# Patient Record
Sex: Male | Born: 1970 | Race: White | Hispanic: No | Marital: Married | Smoking: Former smoker
Health system: Southern US, Community
[De-identification: ages and names within clinical notes are randomized; demographics above are authoritative.]

## PROBLEM LIST (undated history)

## (undated) DIAGNOSIS — E8881 Metabolic syndrome: Secondary | ICD-10-CM

## (undated) DIAGNOSIS — K760 Fatty (change of) liver, not elsewhere classified: Secondary | ICD-10-CM

## (undated) DIAGNOSIS — I700 Atherosclerosis of aorta: Secondary | ICD-10-CM

## (undated) DIAGNOSIS — I10 Essential (primary) hypertension: Secondary | ICD-10-CM

## (undated) DIAGNOSIS — J309 Allergic rhinitis, unspecified: Secondary | ICD-10-CM

## (undated) DIAGNOSIS — F3341 Major depressive disorder, recurrent, in partial remission: Secondary | ICD-10-CM

## (undated) DIAGNOSIS — F17200 Nicotine dependence, unspecified, uncomplicated: Secondary | ICD-10-CM

## (undated) DIAGNOSIS — J45909 Unspecified asthma, uncomplicated: Secondary | ICD-10-CM

## (undated) HISTORY — DX: Allergic rhinitis, unspecified: J30.9

## (undated) HISTORY — DX: Essential (primary) hypertension: I10

## (undated) HISTORY — DX: Fatty (change of) liver, not elsewhere classified: K76.0

## (undated) HISTORY — PX: NASAL SEPTOPLASTY W/ TURBINOPLASTY: SHX2070

## (undated) HISTORY — DX: Major depressive disorder, recurrent, in partial remission: F33.41

## (undated) HISTORY — DX: Atherosclerosis of aorta: I70.0

## (undated) HISTORY — DX: Nicotine dependence, unspecified, uncomplicated: F17.200

## (undated) HISTORY — DX: Unspecified asthma, uncomplicated: J45.909

## (undated) HISTORY — DX: Metabolic syndrome: E88.810

## (undated) HISTORY — DX: Metabolic syndrome: E88.81

---

## 2016-08-14 DIAGNOSIS — Z1389 Encounter for screening for other disorder: Secondary | ICD-10-CM | POA: Diagnosis not present

## 2016-08-14 DIAGNOSIS — R030 Elevated blood-pressure reading, without diagnosis of hypertension: Secondary | ICD-10-CM | POA: Diagnosis not present

## 2016-08-14 DIAGNOSIS — Z720 Tobacco use: Secondary | ICD-10-CM | POA: Diagnosis not present

## 2016-09-08 DIAGNOSIS — Z0000 Encounter for general adult medical examination without abnormal findings: Secondary | ICD-10-CM | POA: Diagnosis not present

## 2016-09-08 DIAGNOSIS — Z131 Encounter for screening for diabetes mellitus: Secondary | ICD-10-CM | POA: Diagnosis not present

## 2016-09-08 DIAGNOSIS — Z125 Encounter for screening for malignant neoplasm of prostate: Secondary | ICD-10-CM | POA: Diagnosis not present

## 2017-02-10 DIAGNOSIS — J0100 Acute maxillary sinusitis, unspecified: Secondary | ICD-10-CM | POA: Diagnosis not present

## 2017-02-11 DIAGNOSIS — S60222A Contusion of left hand, initial encounter: Secondary | ICD-10-CM | POA: Insufficient documentation

## 2017-02-11 DIAGNOSIS — Y929 Unspecified place or not applicable: Secondary | ICD-10-CM | POA: Diagnosis not present

## 2017-02-11 DIAGNOSIS — Y990 Civilian activity done for income or pay: Secondary | ICD-10-CM | POA: Insufficient documentation

## 2017-02-11 DIAGNOSIS — W51XXXA Accidental striking against or bumped into by another person, initial encounter: Secondary | ICD-10-CM | POA: Diagnosis not present

## 2017-02-11 DIAGNOSIS — Y939 Activity, unspecified: Secondary | ICD-10-CM | POA: Diagnosis not present

## 2017-02-11 DIAGNOSIS — S6992XA Unspecified injury of left wrist, hand and finger(s), initial encounter: Secondary | ICD-10-CM | POA: Diagnosis present

## 2017-02-12 ENCOUNTER — Other Ambulatory Visit: Payer: Self-pay

## 2017-02-12 ENCOUNTER — Emergency Department (HOSPITAL_COMMUNITY)
Admission: EM | Admit: 2017-02-12 | Discharge: 2017-02-12 | Disposition: A | Discharge status: Home / Self Care | Payer: Worker's Compensation | Attending: Emergency Medicine | Admitting: Emergency Medicine

## 2017-02-12 ENCOUNTER — Emergency Department (HOSPITAL_COMMUNITY): Payer: Worker's Compensation

## 2017-02-12 ENCOUNTER — Encounter (HOSPITAL_COMMUNITY): Payer: Self-pay | Admitting: Emergency Medicine

## 2017-02-12 DIAGNOSIS — S60222A Contusion of left hand, initial encounter: Secondary | ICD-10-CM
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
## 2017-02-12 NOTE — ED Notes (Signed)
Provider notified of elevated BP

## 2017-02-12 NOTE — Discharge Instructions (Signed)
X-ray was negative.  We suspect bony contusion. Ice and elevate as needed.  Anti-inflammatories for pain. Follow-up with PCP if any ongoing issues. Return here for any new/worsening symptoms.
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
## 2017-05-07 DIAGNOSIS — E8881 Metabolic syndrome: Secondary | ICD-10-CM | POA: Diagnosis not present

## 2017-05-07 DIAGNOSIS — I10 Essential (primary) hypertension: Secondary | ICD-10-CM | POA: Diagnosis not present

## 2017-05-07 DIAGNOSIS — Z720 Tobacco use: Secondary | ICD-10-CM | POA: Diagnosis not present

## 2017-12-29 DIAGNOSIS — I10 Essential (primary) hypertension: Secondary | ICD-10-CM | POA: Diagnosis not present

## 2017-12-29 DIAGNOSIS — Z720 Tobacco use: Secondary | ICD-10-CM | POA: Diagnosis not present

## 2021-03-25 ENCOUNTER — Encounter: Payer: Self-pay | Admitting: Pulmonary Disease

## 2021-03-25 ENCOUNTER — Ambulatory Visit (INDEPENDENT_AMBULATORY_CARE_PROVIDER_SITE_OTHER): Payer: 59 | Admitting: Pulmonary Disease

## 2021-03-25 ENCOUNTER — Other Ambulatory Visit: Payer: Self-pay

## 2021-03-25 VITALS — BP 154/84 | HR 72 | Temp 98.4°F | Ht 74.0 in | Wt 227.0 lb

## 2021-03-25 DIAGNOSIS — R059 Cough, unspecified: Secondary | ICD-10-CM

## 2021-03-25 LAB — CBC WITH DIFFERENTIAL/PLATELET
Basophils Absolute: 0.1 10*3/uL (ref 0.0–0.1)
Basophils Relative: 0.4 % (ref 0.0–3.0)
Eosinophils Absolute: 0.2 10*3/uL (ref 0.0–0.7)
Eosinophils Relative: 1.5 % (ref 0.0–5.0)
HCT: 49.3 % (ref 39.0–52.0)
Hemoglobin: 16.6 g/dL (ref 13.0–17.0)
Lymphocytes Relative: 36.2 % (ref 12.0–46.0)
Lymphs Abs: 5.1 10*3/uL — ABNORMAL HIGH (ref 0.7–4.0)
MCHC: 33.6 g/dL (ref 30.0–36.0)
MCV: 90.2 fl (ref 78.0–100.0)
Monocytes Absolute: 1.1 10*3/uL — ABNORMAL HIGH (ref 0.1–1.0)
Monocytes Relative: 7.6 % (ref 3.0–12.0)
Neutro Abs: 7.7 10*3/uL (ref 1.4–7.7)
Neutrophils Relative %: 54.3 % (ref 43.0–77.0)
Platelets: 255 10*3/uL (ref 150.0–400.0)
RBC: 5.47 Mil/uL (ref 4.22–5.81)
RDW: 13.3 % (ref 11.5–15.5)
WBC: 14.2 10*3/uL — ABNORMAL HIGH (ref 4.0–10.5)

## 2021-03-25 NOTE — Patient Instructions (Signed)
We will get some labs including CBC differential, IgE Schedule high-res CT and PFTs for better evaluation of the lung Follow-up in clinic in 1 to 2 months

## 2021-03-25 NOTE — Progress Notes (Deleted)
° °  NAME:  Wayne Davidson, MRN:  [id number], DOB:  1970-03-29, LOS: 0 ADMISSION DATE:  (Not on file), CONSULTATION DATE:  *** REFERRING MD:  ***, CHIEF COMPLAINT:  ***   History of Present Illness:  ***  Pertinent  Medical History  ***  Significant Hospital Events: Including procedures, antibiotic start and stop dates in addition to other pertinent events     Interim History / Subjective:  ***  Objective   Blood pressure (!) 154/84, pulse 72, temperature 98.4 F (36.9 C), temperature source Oral, height 6\' 2"  (1.88 m), weight 227 lb (103 kg), SpO2 97 %. @HEMODYNAMICS @     @IOBRIEF @   03/25/21 0853  Weight: 227 lb (103 kg)    Examination: General: *** HENT: *** Lungs: *** Cardiovascular: *** Abdomen: *** Extremities: *** Neuro: *** GU: ***  Resolved Hospital Problem list   ***  Assessment & Plan:  ***  Best Practice (right click and "Reselect all SmartList Selections" daily)   Diet/type: {diet type:25684} DVT prophylaxis: {anticoagulation (Optional):25687} GI prophylaxis: Lines: {Central Venous Access:25771} Foley:  {Central Venous Access:25691} Code Status:  {Code Status:26939} Last date of multidisciplinary goals of care discussion [***]  Labs   CBC: No results for input(s): WBC, NEUTROABS, HGB, HCT, MCV, PLT in the last 168 hours.  Basic Metabolic Panel: No results for input(s): NA, K, CL, CO2, GLUCOSE, BUN, CREATININE, CALCIUM, MG, PHOS in the last 168 hours. GFR: CrCl cannot be calculated (No successful lab value found.). No results for input(s): PROCALCITON, WBC, LATICACIDVEN in the last 168 hours.  Liver Function Tests: No results for input(s): AST, ALT, ALKPHOS, BILITOT, PROT, ALBUMIN in the last 168 hours. No results for input(s): LIPASE, AMYLASE in the last 168 hours. No results for input(s): AMMONIA in the last 168 hours.  ABG No results found for: PHART, PCO2ART, PO2ART, HCO3, TCO2, ACIDBASEDEF, O2SAT    Coagulation Profile: No results for input(s): INR, PROTIME in the last 168 hours.  Cardiac Enzymes: No results for input(s): CKTOTAL, CKMB, CKMBINDEX, TROPONINI in the last 168 hours.  HbA1C: No results found for: HGBA1C  CBG: No results for input(s): GLUCAP in the last 168 hours.  Review of Systems:   ***  Past Medical History:  He,  has no past medical history on file.   Surgical History:  No past surgical history on file.   Social History:   reports that he has never smoked. He has quit using smokeless tobacco. He reports current alcohol use. He reports that he does not use drugs.   Family History:  His family history includes Diabetes in an other family member; Hypertension in an other family member.   Allergies Allergies  Allergen Reactions   Naprosyn [Naproxen]      Home Medications  Prior to Admission medications   Medication Sig Start Date End Date Taking? Authorizing Provider  albuterol (VENTOLIN HFA) 108 (90 Base) MCG/ACT inhaler Inhale 2 puffs into the lungs every 4 (four) hours as needed. 03/19/21   [provider]  buPROPion (WELLBUTRIN SR) 150 MG 12 hr tablet Take 150 mg by mouth 2 (two) times daily. 03/24/21   [provider]  levocetirizine (XYZAL) 5 MG tablet SMARTSIG:1 Tablet(s) By Mouth Every Evening 02/08/21   [provider]     Critical care time: ***

## 2021-03-25 NOTE — Progress Notes (Signed)
Wayne Kuperman    MD:[id number]    [birth year]  Primary Care Physician:[facility], [facility]  Referring Physician: Physicians, [place],  [place] [address]  Chief complaint: Consult for chronic cough  HPI: 51 year old with history of allergics, deviated septum, prior COVID infection Complains of chronic cough, intermittent chest tightness for the past 1 year.  Symptoms worsened after COVID infection in October 2021 when he was hospitalized at [facility] for 2 days Cough is usually nonproductive in nature with no fevers or chills  He was evaluated by urgent care at [facility] in early January 2023 with a chest x-ray, EKG and blood work and was told they were all normal. Recently started on antiacid medication by his primary care doctor.  He has history of intermittent allergies and deviated nasal septum and underwent sinus surgery in November 2022 by Dr. Gaylyn Cheers, ENT in [place].  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  No feather pillows or comforters Smoking history: 5-pack-year smoker.  Quit in 2022 Travel history: Born in [place].  No significant recent travel Relevant family history: No family history of lung disease  Outpatient Encounter Medications as of 03/25/2021  Medication Sig   albuterol (VENTOLIN HFA) 108 (90 Base) MCG/ACT inhaler Inhale 2 puffs into the lungs every 4 (four) hours as needed.   buPROPion (WELLBUTRIN SR) 150 MG 12 hr tablet Take 150 mg by mouth 2 (two) times daily.   levocetirizine (XYZAL) 5 MG tablet SMARTSIG:1 Tablet(s) By Mouth Every Evening   No facility-administered encounter medications on file as of 03/25/2021.    Allergies as of 03/25/2021 - Review Complete 03/25/2021  Allergen Reaction Noted   Naprosyn [naproxen]   02/12/2017    No past medical history on file.  No past surgical history on file.  Family History  Problem Relation Age of Onset   Hypertension Other    Diabetes Other     Social History   Socioeconomic History   Marital status: Married    Spouse name: Not on file   Number of children: Not on file   Years of education: Not on file   Highest education level: Not on file  Occupational History   Not on file  Tobacco Use   Smoking status: Never   Smokeless tobacco: Former  Substance and Sexual Activity   Alcohol use: Yes    Comment: social   Drug use: No   Sexual activity: Not on file  Other Topics Concern   Not on file  Social History Narrative   Not on file   Social Determinants of Health   Financial Resource Strain: Not on file  Food Insecurity: Not on file  Transportation Needs: Not on file  Physical Activity: Not on file  Stress: Not on file  Social Connections: Not on file  Intimate Partner Violence: Not on file    Review of systems: Review of Systems  Constitutional: Negative for fever and chills.  HENT: Negative.   Eyes: Negative for blurred vision.  Respiratory: as per HPI  Cardiovascular: Negative for chest pain and palpitations.  Gastrointestinal: Negative for vomiting, diarrhea, blood per rectum. Genitourinary: Negative for dysuria, urgency, frequency and hematuria.  Musculoskeletal: Negative for myalgias, back pain and joint pain.  Skin: Negative for itching and rash.  Neurological: Negative for dizziness, tremors, focal weakness, seizures and loss of consciousness.  Endo/Heme/Allergies: Negative for environmental allergies.  Psychiatric/Behavioral: Negative for depression, suicidal ideas and hallucinations.  All other systems reviewed and are negative.  Physical Exam: Blood pressure (!) 154/84, pulse 72, temperature 98.4 F (36.9 C), temperature source Oral, height 6\' 2"  (1.88 m), weight 227 lb (103 kg), SpO2 97 %. Gen:      No acute  distress HEENT:  EOMI, sclera anicteric Neck:     No masses; no thyromegaly Lungs:    Clear to auscultation bilaterally; normal respiratory effort CV:         Regular rate and rhythm; no murmurs Abd:      + bowel sounds; soft, non-tender; no palpable masses, no distension Ext:    No edema; adequate peripheral perfusion Skin:      Warm and dry; no rash Neuro: alert and oriented x 3 Psych: normal mood and affect  Data Reviewed: Imaging: CT angiogram [place] 12/24/2019-moderate bilateral multifocal pulmonary infiltrates, no pulmonary embolism I have reviewed the images personally  PFTs:  Labs:  Assessment:  Chronic cough Exacerbated by COVID infection in 2021 Suspect he may have baseline reactive airway disease given history of chronic allergies, sinusitis, deviated nasal septum.  This may have been exacerbated by COVID-19. Will also need assessment for post-COVID interstitial lung disease as he had bilateral inflammatory changes during his COVID infection in 2021  He is getting treated for postnasal drip with Flonase and Xyzal and for GERD with recent addition of a PPI Check CBC differential, IgE Schedule high-res CT and PFTs.  Based on these review he may need additional treatment with inhalers or steroids.  Plan/Recommendations: CBC, IgE High-res CT, PFTs  Marshell Garfinkel MD [facility] 03/25/2021, 9:37 AM  CC: [facility], [facility]*

## 2021-03-27 LAB — IGE: IgE (Immunoglobulin E), Serum: 187 kU/L — ABNORMAL HIGH (ref <=114)

## 2021-04-11 ENCOUNTER — Ambulatory Visit (INDEPENDENT_AMBULATORY_CARE_PROVIDER_SITE_OTHER)
Admission: RE | Admit: 2021-04-11 | Discharge: 2021-04-11 | Disposition: A | Discharge status: Home / Self Care | Payer: 59 | Source: Ambulatory Visit | Attending: Pulmonary Disease | Admitting: Pulmonary Disease

## 2021-04-11 ENCOUNTER — Other Ambulatory Visit: Payer: Self-pay

## 2021-04-11 DIAGNOSIS — R059 Cough, unspecified: Secondary | ICD-10-CM

## 2021-04-15 ENCOUNTER — Telehealth: Payer: Self-pay | Admitting: Pulmonary Disease

## 2021-04-15 NOTE — Telephone Encounter (Signed)
CT does not show any significant lung issues or changes from COVID infection. There are findings suggestive of mild asthma. I will discuss in detail at time of return visit  His labs also show mild elevation in WBC count and we will repeat them at return clinic visit.

## 2021-04-15 NOTE — Telephone Encounter (Signed)
Spoke to patient, who is requesting CT results.   Dr. Vaughan Browner, please advise. Thanks

## 2021-04-15 NOTE — Telephone Encounter (Signed)
Patient is aware of results and voiced his understanding.  Nothing further needed at this time.   

## 2021-05-05 ENCOUNTER — Other Ambulatory Visit: Payer: Self-pay | Admitting: Pulmonary Disease

## 2021-05-05 LAB — SARS CORONAVIRUS 2 (TAT 6-24 HRS): SARS Coronavirus 2: NEGATIVE

## 2021-05-08 ENCOUNTER — Other Ambulatory Visit: Payer: Self-pay

## 2021-05-08 ENCOUNTER — Encounter: Payer: Self-pay | Admitting: Pulmonary Disease

## 2021-05-08 ENCOUNTER — Ambulatory Visit: Payer: 59 | Admitting: Pulmonary Disease

## 2021-05-08 ENCOUNTER — Ambulatory Visit (INDEPENDENT_AMBULATORY_CARE_PROVIDER_SITE_OTHER): Payer: 59 | Admitting: Pulmonary Disease

## 2021-05-08 VITALS — BP 138/80 | HR 88 | Temp 98.2°F | Ht 73.0 in | Wt 224.0 lb

## 2021-05-08 DIAGNOSIS — R059 Cough, unspecified: Secondary | ICD-10-CM

## 2021-05-08 DIAGNOSIS — J4530 Mild persistent asthma, uncomplicated: Secondary | ICD-10-CM

## 2021-05-08 LAB — PULMONARY FUNCTION TEST
DL/VA % pred: 98 %
DL/VA: 4.31 ml/min/mmHg/L
DLCO cor % pred: 92 %
DLCO cor: 29.68 ml/min/mmHg
DLCO unc % pred: 97 %
DLCO unc: 31.23 ml/min/mmHg
FEF 25-75 Post: 5.25 L/sec
FEF 25-75 Pre: 4.14 L/sec
FEF2575-%Change-Post: 26 %
FEF2575-%Pred-Post: 141 %
FEF2575-%Pred-Pre: 111 %
FEV1-%Change-Post: 5 %
FEV1-%Pred-Post: 91 %
FEV1-%Pred-Pre: 86 %
FEV1-Post: 3.92 L
FEV1-Pre: 3.7 L
FEV1FVC-%Change-Post: 0 %
FEV1FVC-%Pred-Pre: 110 %
FEV6-%Change-Post: 5 %
FEV6-%Pred-Post: 85 %
FEV6-%Pred-Pre: 80 %
FEV6-Post: 4.57 L
FEV6-Pre: 4.33 L
FEV6FVC-%Pred-Post: 103 %
FEV6FVC-%Pred-Pre: 103 %
FVC-%Change-Post: 5 %
FVC-%Pred-Post: 82 %
FVC-%Pred-Pre: 78 %
FVC-Post: 4.57 L
FVC-Pre: 4.33 L
Post FEV1/FVC ratio: 86 %
Post FEV6/FVC ratio: 100 %
Pre FEV1/FVC ratio: 85 %
Pre FEV6/FVC Ratio: 100 %
RV % pred: 97 %
RV: 2.17 L
TLC % pred: 93 %
TLC: 7.06 L

## 2021-05-08 MED ORDER — BUDESONIDE-FORMOTEROL FUMARATE 160-4.5 MCG/ACT IN AERO: 2.0000 | INHALATION_SPRAY | Freq: Two times a day (BID) | RESPIRATORY_TRACT | 5 refills | Status: DC

## 2021-05-08 NOTE — Progress Notes (Signed)
Full PFT performed today. °

## 2021-05-08 NOTE — Progress Notes (Signed)
Kron Markunas    MD:[id number]    January 24, 1971  Primary Care Physician:[facility]  Referring Physician: Phys[facility], [address] [place],  [place] [address]  Chief complaint: Follow-up for chronic cough, mild asthma  HPI: 51 year old with history of allergics, deviated septum, prior COVID infection Complains of chronic cough, intermittent chest tightness for the past 1 year.  Symptoms worsened after COVID infection in October 2021 when he was hospitalized at [facility] for 2 days Cough is usually nonproductive in nature with no fevers or chills  He was evaluated by urgent care at [facility] in early January 2023 with a chest x-ray, EKG and blood work and was told they were all normal. Recently started on antiacid medication by his primary care doctor.  He has history of intermittent allergies and deviated nasal septum and underwent sinus surgery in November 2022 by Dr. Gaylyn Cheers, ENT in [place].  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  No feather pillows or comforters Smoking history: 5-pack-year smoker.  Quit in 2022 Travel history: Born in [place].  No significant recent travel Relevant family history: No family history of lung disease  Interim history: Here for review of CT and PFTs States that cough is stable with no change.  Continues to feel intermittent chest tightness  Outpatient Encounter Medications as of 05/08/2021  Medication Sig   albuterol (VENTOLIN HFA) 108 (90 Base) MCG/ACT inhaler Inhale 2 puffs into the lungs every 4 (four) hours as needed.   buPROPion (WELLBUTRIN SR) 150 MG 12 hr tablet Take 150 mg by mouth 2 (two) times daily.   levocetirizine (XYZAL) 5 MG tablet SMARTSIG:1 Tablet(s) By Mouth Every Evening   No facility-administered  encounter medications on file as of 05/08/2021.    Physical Exam: Blood pressure 138/80, pulse 88, temperature 98.2 F (36.8 C), temperature source Oral, height 6\' 1"  (1.854 m), weight 224 lb (101.6 kg), SpO2 100 %. Gen:      No acute distress HEENT:  EOMI, sclera anicteric Neck:     No masses; no thyromegaly Lungs:    Clear to auscultation bilaterally; normal respiratory effort CV:         Regular rate and rhythm; no murmurs Abd:      + bowel sounds; soft, non-tender; no palpable masses, no distension Ext:    No edema; adequate peripheral perfusion Skin:      Warm and dry; no rash Neuro: alert and oriented x 3 Psych: normal mood and affect   Data Reviewed: Imaging: CT angiogram [place] 12/24/2019-moderate bilateral multifocal pulmonary infiltrates, no pulmonary embolism  High-resolution CT chest 04/11/2021-no ILD, mild air trapping, aortic atherosclerosis and hepatic steatosis I have reviewed the images personally  PFTs: 05/08/2021 FVC 4.57 [82%], FEV1 3.92 [91%], F/F 86, TLC 7.06 [93%], DLCO 31.23 [97%] Normal test  Labs: CBC 03/25/2021-WBC 14.2, eos 1.5%, absolute eosinophil count 213 IgE 03/25/2021-187  Assessment:  Chronic cough, mild asthma Exacerbated by COVID infection in 2021 Suspect he may have baseline reactive airway disease given history of chronic allergies, sinusitis, deviated nasal septum and elevated IgE.    CT reviewed with mild trapping and no evidence of interstitial lung disease or post-COVID issues He is getting treated for postnasal drip with Flonase and Xyzal and  for GERD with recent addition of a PPI Trial of Symbicort 160  Aortic atherosclerosis, hepatic steatosis Noted on CT.  We discussed this in office today.  He will follow-up with primary care regarding this.  Plan/Recommendations: Symbicort 160 Continue Flonase, Xyzal, PPI  Marshell Garfinkel MD [place] [facility] 05/08/2021, 10:04 AM  CC: [facility], [place]

## 2021-05-08 NOTE — Patient Instructions (Signed)
Full PFT performed today. °

## 2021-05-08 NOTE — Patient Instructions (Signed)
I have reviewed your CT scan which does not show significant lung issues.  There is evidence of plaque buildup in the aorta and the liver.  Please follow-up with your primary care about this Your lung function tests are normal which is good news We will start you on an inhaler called Symbicort 160  Follow-up in 4 to 6 months

## 2021-08-07 ENCOUNTER — Telehealth: Payer: Self-pay | Admitting: Pulmonary Disease

## 2021-08-08 NOTE — Telephone Encounter (Signed)
Called patient but he did not answer. Left message for him to call back.  

## 2021-09-02 NOTE — Progress Notes (Deleted)
Cardiology Office Note:    Date:  09/02/2021   ID:  Wayne Davidson, DOB Jul 17, 1970, MRN [id number]  PCP:  Physicians, Cheryl[facility] Providers Cardiologist:  None {    Referring MD: Physicians, Quin Hoop*    History of Present Illness:    Wayne Davidson is a 51 y.o. male with a hx of aortic atherosclerosis, asthma, HTN, tobacco abuse and depression who was referred by [id number] for aortic atherosclerosis.  Today, ***  Past Medical History:  Diagnosis Date   Allergic rhinitis    Aortic atherosclerosis (HCC)    Asthma    Hepatic steatosis    HTN (hypertension)    Metabolic syndrome    Recurrent major depression in partial remission (HCC)    Tobacco use disorder     Past Surgical History:  Procedure Laterality Date   NASAL SEPTOPLASTY W/ TURBINOPLASTY      Current Medications: No outpatient medications have been marked as taking for the 09/04/21 encounter (Appointment) with Meriam Sprague, MD.     Allergies:   Naprosyn [naproxen]   Social History   Socioeconomic History   Marital status: Married    Spouse name: Not on file   Number of children: Not on file   Years of education: Not on file   Highest education level: Not on file  Occupational History   Not on file  Tobacco Use   Smoking status: Former    Types: Cigarettes    Quit date: 09/02/2009    Years since quitting: 12.0   Smokeless tobacco: Former    Types: Chew    Quit date: 09/02/2009  Substance and Sexual Activity   Alcohol use: Yes    Comment: social   Drug use: No   Sexual activity: Not on file  Other Topics Concern   Not on file  Social History Narrative   Not on file   Social Determinants of Health   Financial Resource Strain: Not on file  Food Insecurity: Not on file  Transportation Needs: Not on file  Physical Activity: Not on file  Stress: Not on file  Social Connections: Not on file     Family History: The patient's ***family history  includes Diabetes in his mother and another family member; Hyperlipidemia in his father; Hypertension in his father, mother, and another family member; Neurofibromatosis in his brother; Obesity in his father.  ROS:   Please see the history of present illness.    *** All other systems reviewed and are negative.  EKGs/Labs/Other Studies Reviewed:    The following studies were reviewed today: CT Chest 03/2021: FINDINGS: Cardiovascular: Heart size is normal. There is no significant pericardial fluid, thickening or pericardial calcification. Aortic atherosclerosis. No definite coronary artery calcifications.   Mediastinum/Nodes: No pathologically enlarged mediastinal or hilar lymph nodes. Please note that accurate exclusion of hilar adenopathy is limited on noncontrast CT scans. Esophagus is unremarkable in appearance. No axillary lymphadenopathy.   Lungs/Pleura: High-resolution images demonstrate no significant regions of ground-glass attenuation, septal thickening, subpleural reticulation, parenchymal banding, traction bronchiectasis or frank honeycombing to indicate interstitial lung disease. Inspiratory and expiratory imaging demonstrates mild air trapping indicative of mild small airways disease. No acute consolidative airspace disease. No pleural effusions. No suspicious appearing pulmonary nodules or masses are noted.   Upper Abdomen: Diffuse low attenuation throughout the visualized hepatic parenchyma, indicative of hepatic steatosis.   Musculoskeletal: There are no aggressive appearing lytic or blastic lesions noted in the visualized portions of  the skeleton.   IMPRESSION: 1. No evidence to suggest interstitial lung disease. 2. Mild air trapping indicative of mild small airways disease. 3. Aortic atherosclerosis. 4. Hepatic steatosis.   Aortic Atherosclerosis (ICD10-I70.0).    EKG:  EKG is *** ordered today.  The ekg ordered today demonstrates ***  Recent  Labs: 03/25/2021: Hemoglobin 16.6; Platelets 255.0  Recent Lipid Panel No results found for: "CHOL", "TRIG", "HDL", "CHOLHDL", "VLDL", "LDLCALC", "LDLDIRECT"   Risk Assessment/Calculations:   {Does this patient have ATRIAL FIBRILLATION?[phone]}       Physical Exam:    VS:  There were no vitals taken for this visit.    Wt Readings from Last 3 Encounters:  05/08/21 224 lb (101.6 kg)  03/25/21 227 lb (103 kg)     GEN: *** Well nourished, well developed in no acute distress HEENT: Normal NECK: No JVD; No carotid bruits LYMPHATICS: No lymphadenopathy CARDIAC: ***RRR, no murmurs, rubs, gallops RESPIRATORY:  Clear to auscultation without rales, wheezing or rhonchi  ABDOMEN: Soft, non-tender, non-distended MUSCULOSKELETAL:  No edema; No deformity  SKIN: Warm and dry NEUROLOGIC:  Alert and oriented x 3 PSYCHIATRIC:  Normal affect   ASSESSMENT:    No diagnosis found. PLAN:    In order of problems listed above:  #Aortic Atherosclerosis: Noted on CT chest. Has several risk factors for CAD.  -Start crestor 10mg  daily -Check lipid panel today -Goal LDL<70  #HTN:      {Are you ordering a CV Procedure (e.g. stress test, cath, DCCV, TEE, etc)?   Press F2        :    Medication Adjustments/Labs and Tests Ordered: Current medicines are reviewed at length with the patient today.  Concerns regarding medicines are outlined above.  No orders of the defined types were placed in this encounter.  No orders of the defined types were placed in this encounter.   There are no Patient Instructions on file for this visit.   Signed, [id number]}, MD  09/02/2021 7:47 PM    [facility]

## 2021-09-04 ENCOUNTER — Encounter: Payer: Self-pay | Admitting: Cardiology

## 2021-09-04 ENCOUNTER — Ambulatory Visit: Payer: 59 | Admitting: Cardiology

## 2021-09-04 VITALS — BP 138/84 | HR 69 | Ht 73.0 in | Wt 221.8 lb

## 2021-09-04 DIAGNOSIS — I700 Atherosclerosis of aorta: Secondary | ICD-10-CM

## 2021-09-04 DIAGNOSIS — J4520 Mild intermittent asthma, uncomplicated: Secondary | ICD-10-CM

## 2021-09-04 DIAGNOSIS — I10 Essential (primary) hypertension: Secondary | ICD-10-CM | POA: Diagnosis not present

## 2021-09-04 NOTE — Patient Instructions (Signed)
Medication Instructions:   Your physician recommends that you continue on your current medications as directed. Please refer to the Current Medication list given to you today.  *If you need a refill on your cardiac medications before your next appointment, please call your pharmacy*   Follow-Up:  AS NEEDED WITH DR. PEMBERTON   Important Information About Sugar       

## 2021-09-04 NOTE — Progress Notes (Signed)
Cardiology Office Note:    Date:  09/04/2021   ID:  Wayne Davidson, DOB [birth year], MRN [id number]  PCP:  Physicians, Cheryl[facility] Providers Cardiologist:  None {    Referring MD: Physicians, Quin Hoop*    History of Present Illness:    Wayne Davidson is a 51 y.o. male with a hx of aortic atherosclerosis, asthma, HTN, tobacco abuse and depression who was referred by [facility] for aortic atherosclerosis.   Today, he is doing well and he was referred for CT findings. 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 He walks 5000-10000 steps in the evenings with his wife. In January, he experienced chest pain, but found out that it was asthma. Whenever he feels short of breath he uses the albuterol to manage it. Otherwise, no exertional chest pain, SOB, lightheadedness, dizziness or syncope.  He had a physical recently and his LDL 137, triglycerides 78, HDL 41, and total cholesterol is 194. He goes to Dr. Carney Corners at this time.  He is currently losing two pounds each week, and eats a low carb diet. He does not have a blood pressure cuff at this time, but is in the process of looking for one.   He remains compliant with Crestor, and does not smoke.  The patient denies nocturnal dyspnea, orthopnea or peripheral edema.  There have been no palpitations, lightheadedness or syncope.    Past Medical History:  Diagnosis Date   Allergic rhinitis    Aortic atherosclerosis (HCC)    Asthma    Hepatic steatosis    HTN (hypertension)    Metabolic syndrome    Recurrent major depression in partial remission (HCC)    Tobacco use disorder     Past Surgical History:  Procedure Laterality Date   NASAL SEPTOPLASTY W/ TURBINOPLASTY      Current Medications: Current Meds  Medication Sig   albuterol (VENTOLIN HFA) 108 (90 Base) MCG/ACT inhaler Inhale 2 puffs into the lungs every 4 (four) hours as needed.    azelastine (ASTELIN) 0.1 % nasal spray Place 2 sprays into both nostrils 2 (two) times daily. Use in each nostril as directed   budesonide-formoterol (SYMBICORT) 160-4.5 MCG/ACT inhaler Inhale 2 puffs into the lungs in the morning and at bedtime.   buPROPion (WELLBUTRIN SR) 150 MG 12 hr tablet Take 150 mg by mouth 2 (two) times daily.   cyclobenzaprine (FLEXERIL) 5 MG tablet Take 5 mg by mouth 3 (three) times daily as needed for muscle spasms.   fluticasone (FLONASE) 50 MCG/ACT nasal spray Place 2 sprays into both nostrils daily.   levocetirizine (XYZAL) 5 MG tablet SMARTSIG:1 Tablet(s) By Mouth Every Evening   lisinopril (ZESTRIL) 10 MG tablet Take 10 mg by mouth daily.   rosuvastatin (CRESTOR) 10 MG tablet Take 10 mg by mouth daily.   sildenafil (VIAGRA) 50 MG tablet Take 50 mg by mouth as needed for erectile dysfunction.     Allergies:   Naprosyn [naproxen]   Social History   Socioeconomic History   Marital status: Married    Spouse name: Not on file   Number of children: Not on file   Years of education: Not on file   Highest education level: Not on file  Occupational History   Not on file  Tobacco Use   Smoking status: Former    Types: Cigarettes    Quit date: 09/02/2009  Years since quitting: 12.0   Smokeless tobacco: Former    Types: Chew    Quit date: 09/02/2009  Substance and Sexual Activity   Alcohol use: Yes    Comment: social   Drug use: No   Sexual activity: Not on file  Other Topics Concern   Not on file  Social History Narrative   Not on file   Social Determinants of Health   Financial Resource Strain: Not on file  Food Insecurity: Not on file  Transportation Needs: Not on file  Physical Activity: Not on file  Stress: Not on file  Social Connections: Not on file     Family History: The patient's family history includes Diabetes in his mother and another family member; Hyperlipidemia in his father; Hypertension in his father, mother, and another  family member; Neurofibromatosis in his brother; Obesity in his father.  ROS:   Please see the history of present illness.      All other systems reviewed and are negative.  EKGs/Labs/Other Studies Reviewed:    The following studies were reviewed today: CT Chest 03/2021: FINDINGS: Cardiovascular: Heart size is normal. There is no significant pericardial fluid, thickening or pericardial calcification. Aortic atherosclerosis. No definite coronary artery calcifications.   Mediastinum/Nodes: No pathologically enlarged mediastinal or hilar lymph nodes. Please note that accurate exclusion of hilar adenopathy is limited on noncontrast CT scans. Esophagus is unremarkable in appearance. No axillary lymphadenopathy.   Lungs/Pleura: High-resolution images demonstrate no significant regions of ground-glass attenuation, septal thickening, subpleural reticulation, parenchymal banding, traction bronchiectasis or frank honeycombing to indicate interstitial lung disease. Inspiratory and expiratory imaging demonstrates mild air trapping indicative of mild small airways disease. No acute consolidative airspace disease. No pleural effusions. No suspicious appearing pulmonary nodules or masses are noted. Upper Abdomen: Diffuse low attenuation throughout the visualized hepatic parenchyma, indicative of hepatic steatosis.   Musculoskeletal: There are no aggressive appearing lytic or blastic lesions noted in the visualized portions of the skeleton.   IMPRESSION: 1. No evidence to suggest interstitial lung disease. 2. Mild air trapping indicative of mild small airways disease. 3. Aortic atherosclerosis. 4. Hepatic steatosis.   Aortic Atherosclerosis (ICD10-I70.0).    EKG: EKG is personally reviewed.   09/03/21: Sinus bradycardia with HR 56  Recent Labs: 03/25/2021: Hemoglobin 16.6; Platelets 255.0  Recent Lipid Panel No results found for: "CHOL", "TRIG", "HDL", "CHOLHDL", "VLDL", "LDLCALC",  "LDLDIRECT"   Risk Assessment/Calculations:           Physical Exam:    VS:  BP 138/84   Pulse 69   Ht 6\' 1"  (1.854 m)   Wt 221 lb 12.8 oz (100.6 kg)   SpO2 97%   BMI 29.26 kg/m     Wt Readings from Last 3 Encounters:  09/04/21 221 lb 12.8 oz (100.6 kg)  05/08/21 224 lb (101.6 kg)  03/25/21 227 lb (103 kg)     GEN:  Well nourished, well developed in no acute distress HEENT: Normal NECK: No JVD; No carotid bruits CARDIAC: RRR, no murmurs, rubs, gallops RESPIRATORY:  Clear to auscultation without rales, wheezing or rhonchi  ABDOMEN: Soft, non-tender, non-distended MUSCULOSKELETAL:  No edema; No deformity  SKIN: Warm and dry NEUROLOGIC:  Alert and oriented x 3 PSYCHIATRIC:  Normal affect   ASSESSMENT:    1. Aortic atherosclerosis (HCC)   2. Mild intermittent asthma, unspecified whether complicated   3. Primary hypertension    PLAN:    In order of problems listed above:  #Aortic Atherosclerosis: Noted on CT chest.  No significant coronary calcification noted. He is overall active without exertional issues. Has been started on crestor per PCP and is planned for repeat labs in 55months. LDL goal <70. Discussed option of Ca score but he is already on treatment and therefore would not change management at this time.  -Continue crestor 10mg  daily -Check lipid panel with PCP in the next 8-12 weeks -Goal LDL<70  #HTN: 130s in the office today. Does not monitor at home but is willing to start. Discussed goal <130/90s. -Continue lisinoprol 10mg  daily -Monitor BP at home with goal <130/90  #Asthma: Well controlled. On inhalers.      Medication Adjustments/Labs and Tests Ordered: Current medicines are reviewed at length with the patient today.  Concerns regarding medicines are outlined above.  No orders of the defined types were placed in this encounter.  No orders of the defined types were placed in this encounter.   Patient Instructions  Medication Instructions:    Your physician recommends that you continue on your current medications as directed. Please refer to the Current Medication list given to you today.  *If you need a refill on your cardiac medications before your next appointment, please call your pharmacy*   Follow-Up:   AS NEEDED WITH DR.    Important Information About Sugar           I,Tinashe Williams,acting as a scribe for , MD.,have documented all relevant documentation on the behalf of Shari Prows, MD,as directed by  Meriam Sprague, MD while in the presence of Meriam Sprague, MD.  I, Meriam Sprague, MD, have reviewed all documentation for this visit. The documentation on 09/04/21 for the exam, diagnosis, procedures, and orders are all accurate and complete.

## 2021-09-09 ENCOUNTER — Ambulatory Visit: Payer: 59 | Admitting: Pulmonary Disease

## 2021-09-09 ENCOUNTER — Encounter: Payer: Self-pay | Admitting: Pulmonary Disease

## 2021-09-09 VITALS — BP 116/80 | HR 66 | Temp 97.8°F | Ht 73.0 in | Wt 220.8 lb

## 2021-09-09 DIAGNOSIS — J4530 Mild persistent asthma, uncomplicated: Secondary | ICD-10-CM | POA: Diagnosis not present

## 2021-09-09 MED ORDER — BUDESONIDE-FORMOTEROL FUMARATE 160-4.5 MCG/ACT IN AERO: 2.0000 | INHALATION_SPRAY | Freq: Two times a day (BID) | RESPIRATORY_TRACT | 11 refills | Status: AC

## 2021-09-11 NOTE — Telephone Encounter (Signed)
Pt was made aware of the CT results by Dr. Isaiah Serge. Nothing further needed.

## 2023-07-05 IMAGING — CT CT CHEST HIGH RESOLUTION
2 of 7 series · 14 of 36 positions shown, 17 images · non-contrast
Comparison: Chest CTA 12/24/2019.

CLINICAL DATA: 50-year-old male with history of cough and shortness
of breath since COVID infection 1 year ago.



[Series 5: high resolution · axial · 0.71mm/px · z∈[-288,-17]mm · 11 of 327 slices shown, 14 images]
[im 28/327  mediastinal]
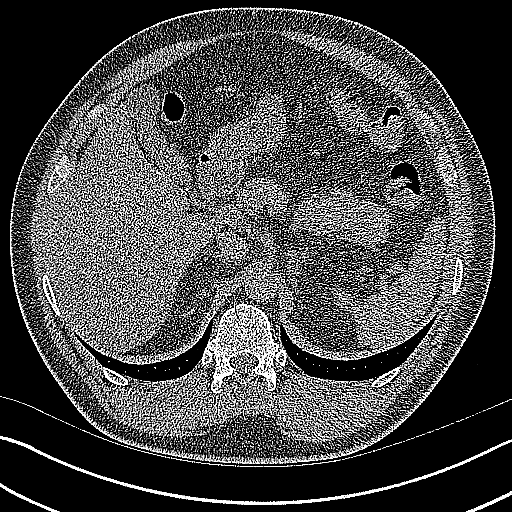
[im 28/327  lung]
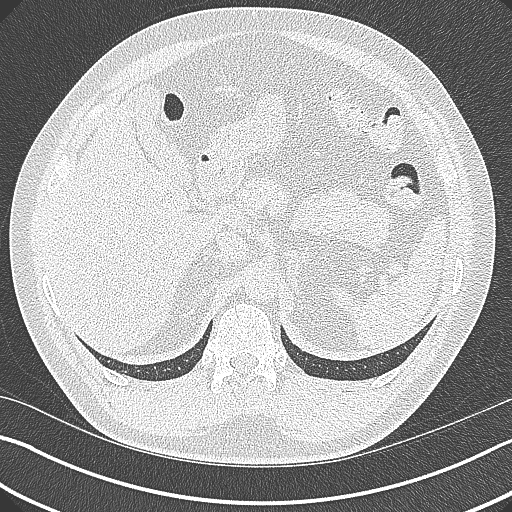
[im 55/327  lung]
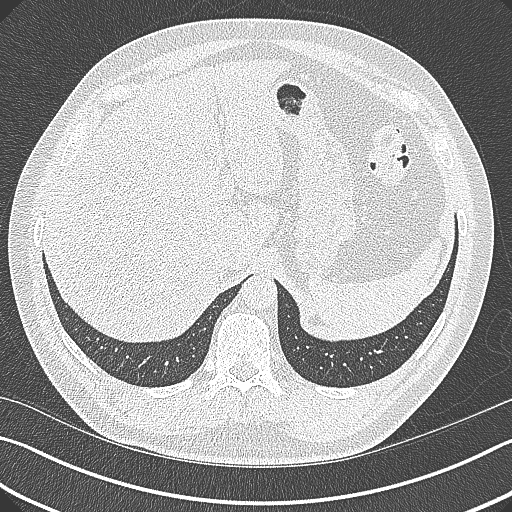
[im 82/327  lung]
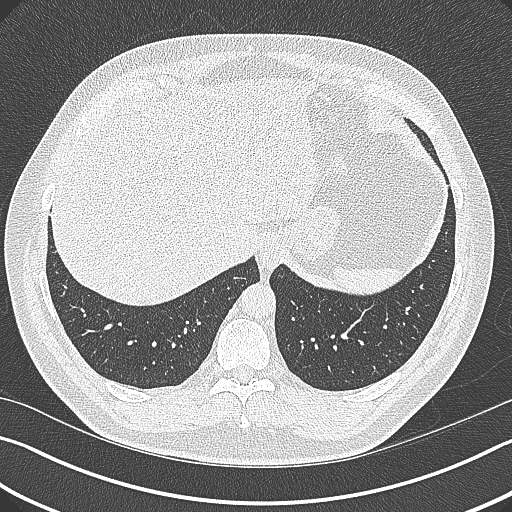
[im 109/327  lung]
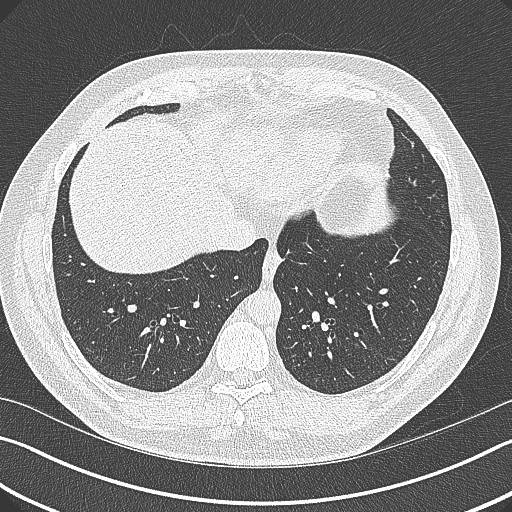
[im 136/327  mediastinal]
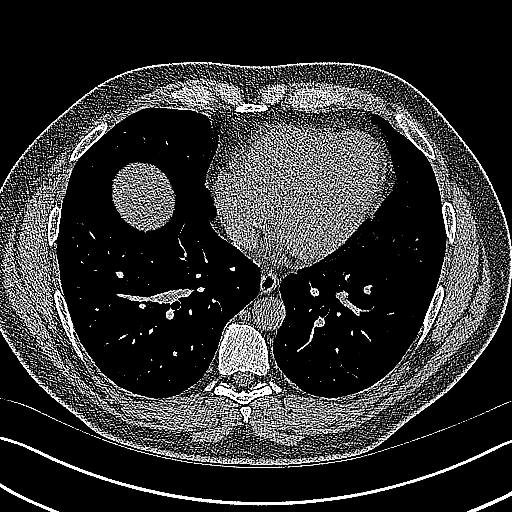
[im 136/327  lung]
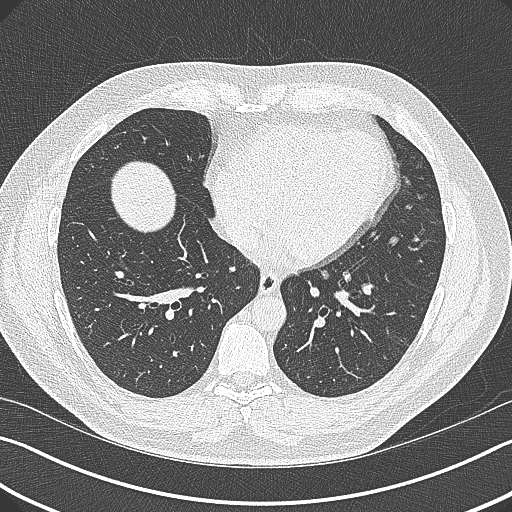
[im 164/327  lung]
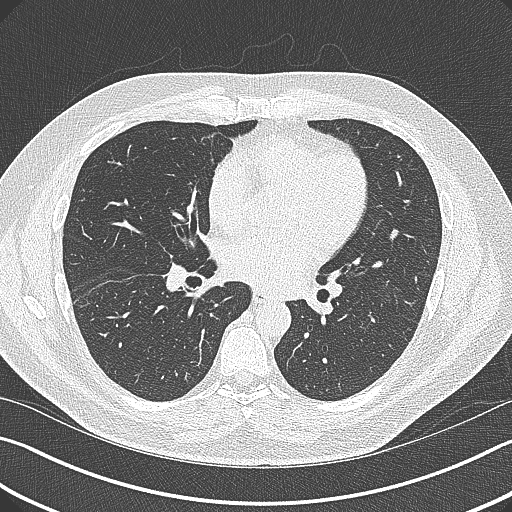
[im 191/327  lung]
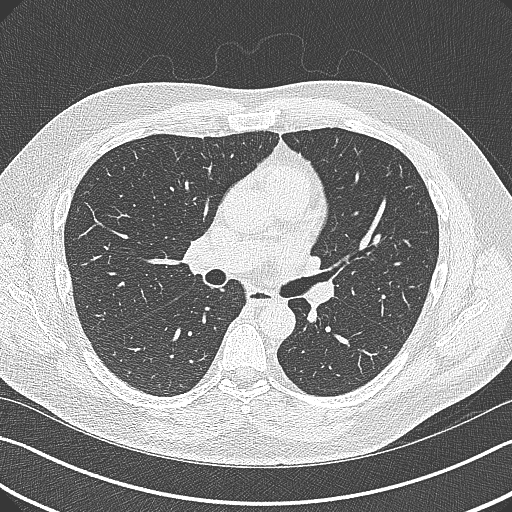
[im 218/327  lung]
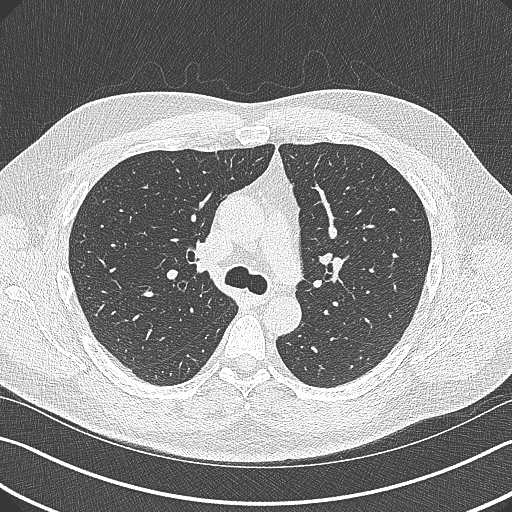
[im 245/327  mediastinal]
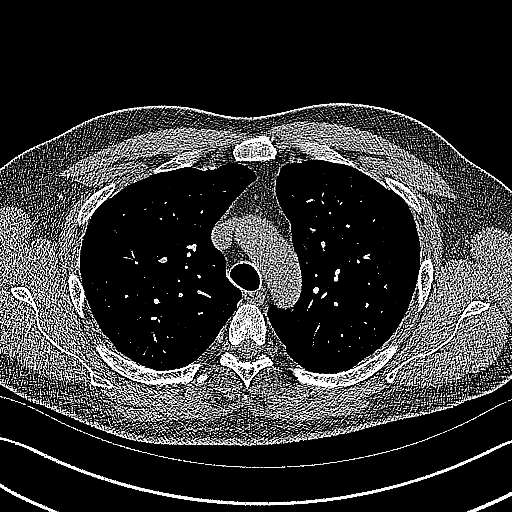
[im 245/327  lung]
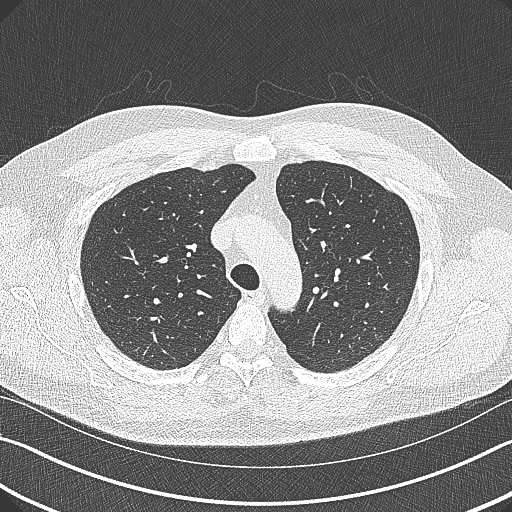
[im 272/327  lung]
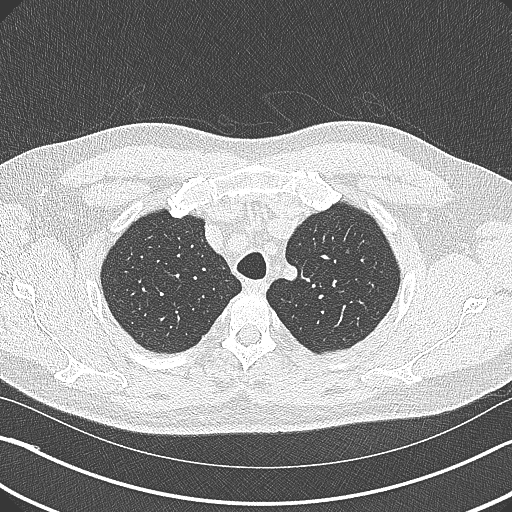
[im 299/327  lung]
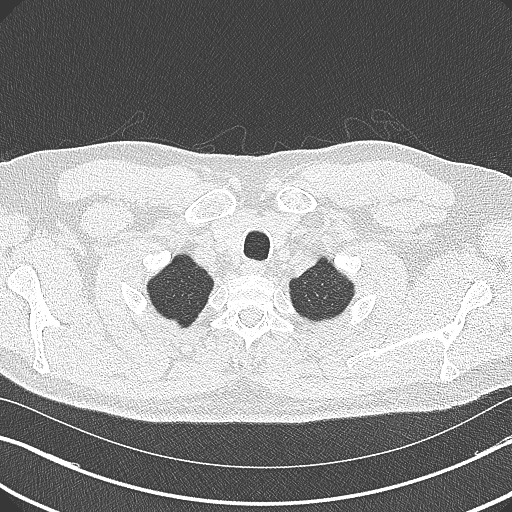

[Series 8: coronal · coronal · 0.65mm/px · 3 of 151 slices shown]
[im 31/151  lung]
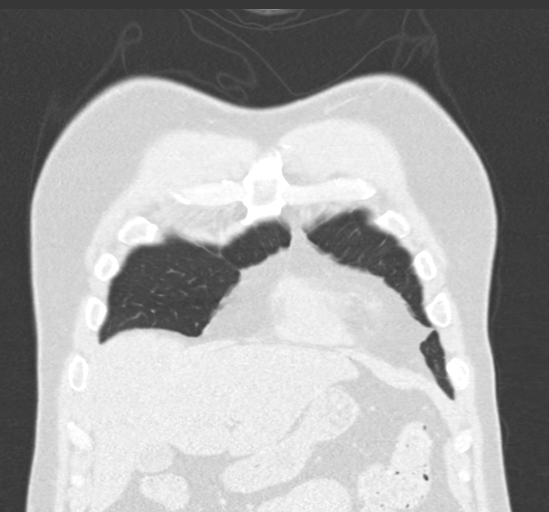
[im 61/151  lung]
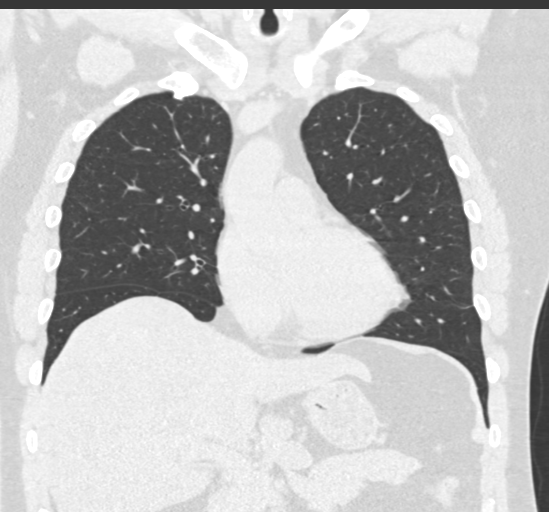
[im 91/151  lung]
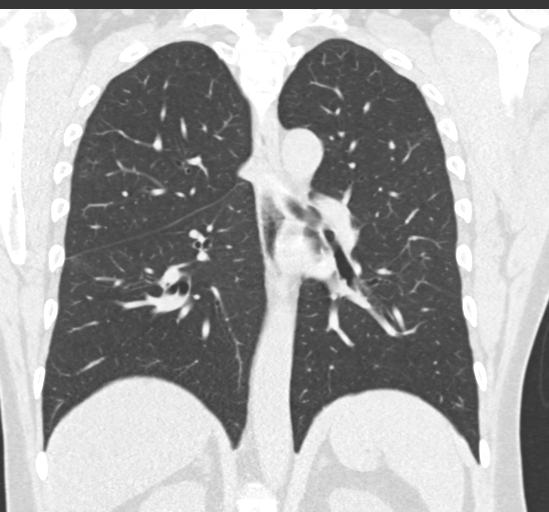

[14 of 36 positions shown; findings below may reference images not displayed]

FINDINGS: Cardiovascular: Heart size is normal. There is no significant
pericardial fluid, thickening or pericardial calcification. Aortic
atherosclerosis. No definite coronary artery calcifications.

Mediastinum/Nodes: No pathologically enlarged mediastinal or hilar
lymph nodes. Please note that accurate exclusion of hilar adenopathy
is limited on noncontrast CT scans. Esophagus is unremarkable in
appearance. No axillary lymphadenopathy.

Lungs/Pleura: High-resolution images demonstrate no significant
regions of ground-glass attenuation, septal thickening, subpleural
reticulation, parenchymal banding, traction bronchiectasis or frank
honeycombing to indicate interstitial lung disease. Inspiratory and
expiratory imaging demonstrates mild air trapping indicative of mild
small airways disease. No acute consolidative airspace disease. No
pleural effusions. No suspicious appearing pulmonary nodules or
masses are noted.

Upper Abdomen: Diffuse low attenuation throughout the visualized
hepatic parenchyma, indicative of hepatic steatosis.

Musculoskeletal: There are no aggressive appearing lytic or blastic
lesions noted in the visualized portions of the skeleton.
IMPRESSION: 1. No evidence to suggest interstitial lung disease.
2. Mild air trapping indicative of mild small airways disease.
3. Aortic atherosclerosis.
4. Hepatic steatosis.

Aortic Atherosclerosis ([id number]).

## 2024-03-20 DIAGNOSIS — R9389 Abnormal findings on diagnostic imaging of other specified body structures: Secondary | ICD-10-CM

## 2024-03-22 ENCOUNTER — Inpatient Hospital Stay (HOSPITAL_BASED_OUTPATIENT_CLINIC_OR_DEPARTMENT_OTHER): Admission: RE | Admit: 2024-03-22 | Admitting: Radiology

## 2024-03-22 ENCOUNTER — Other Ambulatory Visit (HOSPITAL_BASED_OUTPATIENT_CLINIC_OR_DEPARTMENT_OTHER): Payer: Self-pay | Admitting: Internal Medicine

## 2024-03-22 DIAGNOSIS — R9389 Abnormal findings on diagnostic imaging of other specified body structures: Secondary | ICD-10-CM

## 2024-03-30 ENCOUNTER — Ambulatory Visit (HOSPITAL_BASED_OUTPATIENT_CLINIC_OR_DEPARTMENT_OTHER)
Admission: RE | Admit: 2024-03-30 | Discharge: 2024-03-30 | Disposition: A | Payer: Self-pay | Source: Ambulatory Visit | Attending: Internal Medicine | Admitting: Internal Medicine

## 2024-03-30 DIAGNOSIS — R9389 Abnormal findings on diagnostic imaging of other specified body structures: Secondary | ICD-10-CM

## 2024-03-30 MED ORDER — GADOBUTROL 1 MMOL/ML IV SOLN
10.0000 mL | Freq: Once | INTRAVENOUS | Status: AC | PRN
  Administered 2024-03-30: 10 mL via INTRAVENOUS
